# Patient Record
Sex: Female | Born: 2000 | Race: Black or African American | Marital: Single | Smoking: Never smoker
Health system: Southern US, Community
[De-identification: ages and names within clinical notes are randomized; demographics above are authoritative.]

## PROBLEM LIST (undated history)

## (undated) DIAGNOSIS — Z789 Other specified health status: Secondary | ICD-10-CM

---
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
## 2021-08-23 ENCOUNTER — Ambulatory Visit (INDEPENDENT_AMBULATORY_CARE_PROVIDER_SITE_OTHER): Payer: BC Managed Care – PPO | Admitting: Orthopedic Surgery

## 2021-08-23 DIAGNOSIS — S83511A Sprain of anterior cruciate ligament of right knee, initial encounter: Secondary | ICD-10-CM

## 2021-08-24 ENCOUNTER — Encounter: Payer: Self-pay | Admitting: Orthopedic Surgery

## 2021-08-24 ENCOUNTER — Other Ambulatory Visit: Payer: Self-pay

## 2021-08-24 DIAGNOSIS — M25561 Pain in right knee: Secondary | ICD-10-CM

## 2021-08-24 NOTE — Progress Notes (Signed)
   Post-Op Visit Note   Patient: Carolyn Cruz           Date of Birth: [birth year]           MRN: [id number] Visit Date: 08/23/2021 PCP: No primary care provider on file.   Assessment & Plan:  Chief Complaint: No chief complaint on file.  Visit Diagnoses:  1. New tear of anterior cruciate ligament, right, initial encounter     Plan: Patient presents for evaluation of right knee pain.  This was a witnessed injury during a [employer] basketball game.  Patient has had left knee ACL reconstruction done about 2 years ago.  She had several subsequent surgeries to that to regain her motion.  Felt a pop when she injured her knee.  Video is reviewed and it was a valgus collapse type injury.  On examination patient has palpable pedal pulses and the collateral ligaments are stable.  ACL laxity is present.  Exam is slightly difficult because of guarding.  No posterior lateral rotatory instability is noted.  Extensor mechanism is intact.  Fluoroscopic imaging demonstrates intact joint surfaces with no fracture.  Impression is right knee ACL laxity following witnessed valgus collapse injury.  Patient felt a pop in her Lachman and anterior drawer are positive.  Not too much tenderness around the MCL attachment on the femur or tibia.  Plan is MRI scan and rehabilitation to begin as soon as possible to prepare for intervention.  We will see what the scan shows but based on a guarded exam she does have laxity on that right-hand side.  Follow-Up Instructions: No follow-ups on file.   Orders:  No orders of the defined types were placed in this encounter.  No orders of the defined types were placed in this encounter.   Imaging: No results found.  PMFS History: There are no problems to display for this patient.  No past medical history on file.  No family history on file.  No past surgical history on file. Social History   Occupational History   Not on file  Tobacco Use   Smoking status: Not on file    Smokeless tobacco: Not on file  Substance and Sexual Activity   Alcohol use: Not on file   Drug use: Not on file   Sexual activity: Not on file

## 2021-08-26 ENCOUNTER — Ambulatory Visit
Admission: RE | Admit: 2021-08-26 | Discharge: 2021-08-26 | Disposition: A | Discharge status: Home / Self Care | Payer: BC Managed Care – PPO | Source: Ambulatory Visit | Attending: Orthopedic Surgery | Admitting: Orthopedic Surgery

## 2021-08-26 ENCOUNTER — Other Ambulatory Visit: Payer: Self-pay

## 2021-08-26 DIAGNOSIS — M25561 Pain in right knee: Secondary | ICD-10-CM

## 2021-09-13 ENCOUNTER — Other Ambulatory Visit: Payer: Self-pay

## 2021-09-13 ENCOUNTER — Encounter (HOSPITAL_COMMUNITY): Payer: Self-pay | Admitting: Orthopedic Surgery

## 2021-09-13 NOTE — Progress Notes (Signed)
PCP - Denies Cardiologist - Denies  PPM/ICD - Denies  Chest x-ray - Denies EKG - Denies  Stress Test - Denies ECHO - Denies Cardiac Cath - Denies  CPAP - Denies  ERAS Protcol - n/a  COVID TEST- n/a ambulatory sx  Anesthesia review: N  Patient verbally denies any shortness of breath, fever, cough and chest pain during phone call   -------------  SDW INSTRUCTIONS given:  Your procedure is scheduled on 09/18/21.  Report to [facility] Main Entrance "A" at 0530 A.M., and check in at the Admitting office.  Call this number if you have problems the morning of surgery:  [phone]   Remember:  Do not eat after midnight the night before your surgery    Take these medicines the morning of surgery with A SIP OF WATER Tylenol  As of today, STOP taking any Aspirin (unless otherwise instructed by your surgeon) Aleve, Naproxen, Ibuprofen, Motrin, Advil, Goody's, BC's, all herbal medications, fish oil, and all vitamins.                      Do not wear jewelry, make up, or nail polish            Do not wear lotions, powders, perfumes/colognes, or deodorant.            Do not shave 48 hours prior to surgery.  Men may shave face and neck.            Do not bring valuables to the hospital.            [facility] is not responsible for any belongings or valuables.  Do NOT Smoke (Tobacco/Vaping) or drink Alcohol 24 hours prior to your procedure If you use a CPAP at night, you may bring all equipment for your overnight stay.   Contacts, glasses, dentures or bridgework may not be worn into surgery.      For patients admitted to the hospital, discharge time will be determined by your treatment team.   Patients discharged the day of surgery will not be allowed to drive home, and someone needs to stay with them for 24 hours.    Special instructions:   [facility]- Preparing For Surgery  Before surgery, you can play an important role. Because skin is not sterile, your skin needs to be  as free of germs as possible. You can reduce the number of germs on your skin by washing with CHG (chlorahexidine gluconate) Soap before surgery.  CHG is an antiseptic cleaner which kills germs and bonds with the skin to continue killing germs even after washing.    Oral Hygiene is also important to reduce your risk of infection.  Remember - BRUSH YOUR TEETH THE MORNING OF SURGERY WITH YOUR REGULAR TOOTHPASTE  Please do not use if you have an allergy to CHG or antibacterial soaps. If your skin becomes reddened/irritated stop using the CHG.  Do not shave (including legs and underarms) for at least 48 hours prior to first CHG shower. It is OK to shave your face.  Please follow these instructions carefully.   Shower the NIGHT BEFORE SURGERY and the MORNING OF SURGERY with DIAL Soap.   Pat yourself dry with a CLEAN TOWEL.  Wear CLEAN PAJAMAS to bed the night before surgery  Place CLEAN SHEETS on your bed the night of your first shower and DO NOT SLEEP WITH PETS.   Day of Surgery: Please shower morning of surgery  Wear Clean/Comfortable clothing  the morning of surgery Do not apply any deodorants/lotions.   Remember to brush your teeth WITH YOUR REGULAR TOOTHPASTE.   Questions were answered. Patient verbalized understanding of instructions.

## 2021-09-17 NOTE — Anesthesia Preprocedure Evaluation (Addendum)
Anesthesia Evaluation  Patient identified by MRN, date of birth, ID band Patient awake    Reviewed: Allergy & Precautions, H&P , NPO status , Patient's Chart, lab work & pertinent test results  Airway Mallampati: I  TM Distance: >3 FB Neck ROM: Full    Dental no notable dental hx. (+) Teeth Intact, Dental Advisory Given   Pulmonary neg pulmonary ROS,    Pulmonary exam normal breath sounds clear to auscultation       Cardiovascular Exercise Tolerance: Good negative cardio ROS Normal cardiovascular exam Rhythm:Regular Rate:Normal     Neuro/Psych negative neurological ROS  negative psych ROS   GI/Hepatic negative GI ROS, Neg liver ROS,   Endo/Other  negative endocrine ROS  Renal/GU negative Renal ROS  negative genitourinary   Musculoskeletal negative musculoskeletal ROS (+)   Abdominal   Peds negative pediatric ROS (+)  Hematology negative hematology ROS (+)   Anesthesia Other Findings   Reproductive/Obstetrics negative OB ROS                            Anesthesia Physical Anesthesia Plan  ASA: 2  Anesthesia Plan: General and Regional   Post-op Pain Management: Ofirmev IV (intra-op), Ketamine IV and Dilaudid IV   Induction: Intravenous  PONV Risk Score and Plan: 3 and Ondansetron, Dexamethasone and Midazolam  Airway Management Planned: LMA and Oral ETT  Additional Equipment: None  Intra-op Plan:   Post-operative Plan: Extubation in OR  Informed Consent: I have reviewed the patients History and Physical, chart, labs and discussed the procedure including the risks, benefits and alternatives for the proposed anesthesia with the patient or authorized representative who has indicated his/her understanding and acceptance.     Dental advisory given  Plan Discussed with: CRNA, Surgeon and Anesthesiologist  Anesthesia Plan Comments: ( )        Anesthesia Quick  Evaluation

## 2021-09-18 ENCOUNTER — Encounter (HOSPITAL_COMMUNITY)
Admission: RE | Disposition: A | Discharge status: Home / Self Care | Payer: Self-pay | Source: Home / Self Care | Attending: Orthopedic Surgery

## 2021-09-18 ENCOUNTER — Ambulatory Visit (HOSPITAL_COMMUNITY)
Admission: RE | Admit: 2021-09-18 | Discharge: 2021-09-18 | Disposition: A | Discharge status: Home / Self Care | Payer: BC Managed Care – PPO | Attending: Orthopedic Surgery | Admitting: Orthopedic Surgery

## 2021-09-18 ENCOUNTER — Encounter: Payer: Self-pay | Admitting: Orthopedic Surgery

## 2021-09-18 ENCOUNTER — Other Ambulatory Visit: Payer: Self-pay | Admitting: Surgical

## 2021-09-18 ENCOUNTER — Ambulatory Visit (HOSPITAL_COMMUNITY): Payer: BC Managed Care – PPO

## 2021-09-18 ENCOUNTER — Ambulatory Visit (HOSPITAL_COMMUNITY): Payer: BC Managed Care – PPO | Admitting: Anesthesiology

## 2021-09-18 ENCOUNTER — Other Ambulatory Visit: Payer: Self-pay

## 2021-09-18 ENCOUNTER — Encounter (HOSPITAL_COMMUNITY): Payer: Self-pay | Admitting: Orthopedic Surgery

## 2021-09-18 DIAGNOSIS — S83281A Other tear of lateral meniscus, current injury, right knee, initial encounter: Secondary | ICD-10-CM | POA: Insufficient documentation

## 2021-09-18 DIAGNOSIS — Z01818 Encounter for other preprocedural examination: Secondary | ICD-10-CM

## 2021-09-18 DIAGNOSIS — S83511A Sprain of anterior cruciate ligament of right knee, initial encounter: Secondary | ICD-10-CM | POA: Diagnosis not present

## 2021-09-18 DIAGNOSIS — S83511D Sprain of anterior cruciate ligament of right knee, subsequent encounter: Secondary | ICD-10-CM | POA: Diagnosis not present

## 2021-09-18 DIAGNOSIS — Y9367 Activity, basketball: Secondary | ICD-10-CM | POA: Insufficient documentation

## 2021-09-18 DIAGNOSIS — S83271A Complex tear of lateral meniscus, current injury, right knee, initial encounter: Secondary | ICD-10-CM

## 2021-09-18 DIAGNOSIS — S83271D Complex tear of lateral meniscus, current injury, right knee, subsequent encounter: Secondary | ICD-10-CM

## 2021-09-18 HISTORY — PX: ANTERIOR CRUCIATE LIGAMENT REPAIR: SHX115

## 2021-09-18 HISTORY — DX: Other specified health status: Z78.9

## 2021-09-18 LAB — BASIC METABOLIC PANEL
Anion gap: 10 (ref 5–15)
BUN: 16 mg/dL (ref 6–20)
CO2: 24 mmol/L (ref 22–32)
Calcium: 9.1 mg/dL (ref 8.9–10.3)
Chloride: 103 mmol/L (ref 98–111)
Creatinine, Ser: 0.82 mg/dL (ref 0.44–1.00)
GFR, Estimated: >60 mL/min (ref >60)
Glucose, Bld: 98 mg/dL (ref 70–99)
Potassium: 3.8 mmol/L (ref 3.5–5.1)
Sodium: 137 mmol/L (ref 135–145)

## 2021-09-18 LAB — CBC
HCT: 43.2 % (ref 36.0–46.0)
Hemoglobin: 14.1 g/dL (ref 12.0–15.0)
MCH: 30.9 pg (ref 26.0–34.0)
MCHC: 32.6 g/dL (ref 30.0–36.0)
MCV: 94.7 fL (ref 80.0–100.0)
Platelets: 184 10*3/uL (ref 150–400)
RBC: 4.56 MIL/uL (ref 3.87–5.11)
RDW: 11.3 % — ABNORMAL LOW (ref 11.5–15.5)
WBC: 4.9 10*3/uL (ref 4.0–10.5)
nRBC: 0 % (ref 0.0–0.2)

## 2021-09-18 LAB — POCT PREGNANCY, URINE: Preg Test, Ur: NEGATIVE

## 2021-09-18 SURGERY — RECONSTRUCTION, KNEE, ACL
Anesthesia: Regional | Site: Knee | Laterality: Right

## 2021-09-18 MED ORDER — FENTANYL CITRATE (PF) 250 MCG/5ML IJ SOLN
INTRAMUSCULAR | Status: AC
  Filled 2021-09-18: qty 5

## 2021-09-18 MED ORDER — ACETAMINOPHEN 10 MG/ML IV SOLN
INTRAVENOUS | Status: DC | PRN
  Administered 2021-09-18: 1000 mg via INTRAVENOUS

## 2021-09-18 MED ORDER — BUPIVACAINE HCL (PF) 0.25 % IJ SOLN
INTRAMUSCULAR | Status: AC
  Filled 2021-09-18: qty 30

## 2021-09-18 MED ORDER — MORPHINE SULFATE (PF) 4 MG/ML IV SOLN
INTRAVENOUS | Status: AC
  Filled 2021-09-18: qty 2

## 2021-09-18 MED ORDER — OXYCODONE HCL 5 MG/5ML PO SOLN: 5.0000 mg | Freq: Once | ORAL | Status: AC | PRN

## 2021-09-18 MED ORDER — DEXAMETHASONE SODIUM PHOSPHATE 10 MG/ML IJ SOLN
INTRAMUSCULAR | Status: AC
  Filled 2021-09-18: qty 1

## 2021-09-18 MED ORDER — ROPIVACAINE HCL 7.5 MG/ML IJ SOLN
INTRAMUSCULAR | Status: DC | PRN
Start: 2021-09-18 — End: 2021-09-18
  Administered 2021-09-18: 30 mL via PERINEURAL

## 2021-09-18 MED ORDER — EPINEPHRINE PF 1 MG/ML IJ SOLN
INTRAMUSCULAR | Status: AC
  Filled 2021-09-18: qty 1

## 2021-09-18 MED ORDER — BUPIVACAINE-EPINEPHRINE (PF) 0.25% -1:200000 IJ SOLN
INTRAMUSCULAR | Status: AC
  Filled 2021-09-18: qty 30

## 2021-09-18 MED ORDER — EPINEPHRINE PF 1 MG/ML IJ SOLN
INTRAMUSCULAR | Status: AC
  Filled 2021-09-18: qty 3

## 2021-09-18 MED ORDER — CELECOXIB 200 MG PO CAPS: 200.0000 mg | ORAL_CAPSULE | Freq: Two times a day (BID) | ORAL | 0 refills | Status: AC

## 2021-09-18 MED ORDER — ONDANSETRON HCL 4 MG/2ML IJ SOLN
INTRAMUSCULAR | Status: AC
  Filled 2021-09-18: qty 4

## 2021-09-18 MED ORDER — LIDOCAINE 2% (20 MG/ML) 5 ML SYRINGE
INTRAMUSCULAR | Status: AC
  Filled 2021-09-18: qty 5

## 2021-09-18 MED ORDER — SODIUM CHLORIDE 0.9 % IR SOLN
Status: DC | PRN
  Administered 2021-09-18: 6000 mL
  Administered 2021-09-18: 12000 mL

## 2021-09-18 MED ORDER — ORAL CARE MOUTH RINSE: 15.0000 mL | Freq: Once | OROMUCOSAL | Status: AC

## 2021-09-18 MED ORDER — EPINEPHRINE PF 1 MG/ML IJ SOLN
INTRAMUSCULAR | Status: DC | PRN
  Administered 2021-09-18: 4 mg

## 2021-09-18 MED ORDER — ACETAMINOPHEN 10 MG/ML IV SOLN
INTRAVENOUS | Status: AC
  Filled 2021-09-18: qty 100

## 2021-09-18 MED ORDER — FENTANYL CITRATE (PF) 100 MCG/2ML IJ SOLN: 25.0000 ug | INTRAMUSCULAR | Status: DC | PRN

## 2021-09-18 MED ORDER — POVIDONE-IODINE 7.5 % EX SOLN: Freq: Once | CUTANEOUS | Status: DC

## 2021-09-18 MED ORDER — MIDAZOLAM HCL 2 MG/2ML IJ SOLN
INTRAMUSCULAR | Status: DC | PRN
Start: 2021-09-18 — End: 2021-09-18
  Administered 2021-09-18: 2 mg via INTRAVENOUS

## 2021-09-18 MED ORDER — ONDANSETRON HCL 4 MG/2ML IJ SOLN: 4.0000 mg | Freq: Once | INTRAMUSCULAR | Status: DC | PRN

## 2021-09-18 MED ORDER — MEPERIDINE HCL 25 MG/ML IJ SOLN: 6.2500 mg | INTRAMUSCULAR | Status: DC | PRN

## 2021-09-18 MED ORDER — PHENYLEPHRINE HCL-NACL 20-0.9 MG/250ML-% IV SOLN
INTRAVENOUS | Status: DC | PRN
  Administered 2021-09-18: 20 ug/min via INTRAVENOUS

## 2021-09-18 MED ORDER — ACETAMINOPHEN 160 MG/5ML PO SOLN: 325.0000 mg | ORAL | Status: DC | PRN

## 2021-09-18 MED ORDER — VANCOMYCIN HCL 1000 MG IV SOLR
INTRAVENOUS | Status: AC
  Filled 2021-09-18: qty 20

## 2021-09-18 MED ORDER — MORPHINE SULFATE (PF) 4 MG/ML IV SOLN
INTRAVENOUS | Status: DC | PRN
  Administered 2021-09-18: 8 mg via SUBCUTANEOUS

## 2021-09-18 MED ORDER — POVIDONE-IODINE 10 % EX SWAB
2.0000 "application " | Freq: Once | CUTANEOUS | Status: AC
  Administered 2021-09-18: 2 via TOPICAL

## 2021-09-18 MED ORDER — CLONIDINE HCL (ANALGESIA) 100 MCG/ML EP SOLN
EPIDURAL | Status: DC | PRN
  Administered 2021-09-18: 100 ug

## 2021-09-18 MED ORDER — ONDANSETRON HCL 4 MG/2ML IJ SOLN
INTRAMUSCULAR | Status: DC | PRN
Start: 2021-09-18 — End: 2021-09-18
  Administered 2021-09-18 (×2): 4 mg via INTRAVENOUS

## 2021-09-18 MED ORDER — KETAMINE HCL 10 MG/ML IJ SOLN
INTRAMUSCULAR | Status: DC | PRN
  Administered 2021-09-18: 30 mg via INTRAVENOUS

## 2021-09-18 MED ORDER — OXYCODONE HCL 5 MG PO TABS
ORAL_TABLET | ORAL | Status: AC
  Filled 2021-09-18: qty 1

## 2021-09-18 MED ORDER — CEFAZOLIN SODIUM-DEXTROSE 2-4 GM/100ML-% IV SOLN
2.0000 g | INTRAVENOUS | Status: AC
  Administered 2021-09-18: 2 g via INTRAVENOUS
  Filled 2021-09-18: qty 100

## 2021-09-18 MED ORDER — OXYCODONE-ACETAMINOPHEN 5-325 MG PO TABS: 1.0000 | ORAL_TABLET | ORAL | 0 refills | Status: AC | PRN

## 2021-09-18 MED ORDER — TRANEXAMIC ACID-NACL 1000-0.7 MG/100ML-% IV SOLN
INTRAVENOUS | Status: DC | PRN
  Administered 2021-09-18: 1000 mg via INTRAVENOUS

## 2021-09-18 MED ORDER — TRANEXAMIC ACID-NACL 1000-0.7 MG/100ML-% IV SOLN
INTRAVENOUS | Status: AC
  Filled 2021-09-18: qty 100

## 2021-09-18 MED ORDER — PROPOFOL 500 MG/50ML IV EMUL
INTRAVENOUS | Status: DC | PRN
  Administered 2021-09-18: 25 ug/kg/min via INTRAVENOUS

## 2021-09-18 MED ORDER — ACETAMINOPHEN 325 MG PO TABS: 325.0000 mg | ORAL_TABLET | ORAL | Status: DC | PRN

## 2021-09-18 MED ORDER — LIDOCAINE 2% (20 MG/ML) 5 ML SYRINGE
INTRAMUSCULAR | Status: DC | PRN
Start: 2021-09-18 — End: 2021-09-18
  Administered 2021-09-18: 80 mg via INTRAVENOUS

## 2021-09-18 MED ORDER — CLONIDINE HCL (ANALGESIA) 100 MCG/ML EP SOLN
EPIDURAL | Status: AC
  Filled 2021-09-18: qty 10

## 2021-09-18 MED ORDER — METHOCARBAMOL 500 MG PO TABS: 500.0000 mg | ORAL_TABLET | Freq: Three times a day (TID) | ORAL | 0 refills | Status: AC | PRN

## 2021-09-18 MED ORDER — FENTANYL CITRATE (PF) 250 MCG/5ML IJ SOLN
INTRAMUSCULAR | Status: DC | PRN
  Administered 2021-09-18: 150 ug via INTRAVENOUS
  Administered 2021-09-18 (×3): 50 ug via INTRAVENOUS

## 2021-09-18 MED ORDER — MIDAZOLAM HCL 2 MG/2ML IJ SOLN
INTRAMUSCULAR | Status: AC
  Filled 2021-09-18: qty 2

## 2021-09-18 MED ORDER — LACTATED RINGERS IV SOLN: INTRAVENOUS | Status: DC

## 2021-09-18 MED ORDER — 0.9 % SODIUM CHLORIDE (POUR BTL) OPTIME
TOPICAL | Status: DC | PRN
  Administered 2021-09-18: 1000 mL

## 2021-09-18 MED ORDER — ONDANSETRON HCL 4 MG/2ML IJ SOLN
INTRAMUSCULAR | Status: AC
  Filled 2021-09-18: qty 2

## 2021-09-18 MED ORDER — PROPOFOL 10 MG/ML IV BOLUS
INTRAVENOUS | Status: DC | PRN
  Administered 2021-09-18: 200 mg via INTRAVENOUS

## 2021-09-18 MED ORDER — OXYCODONE HCL 5 MG PO TABS
5.0000 mg | ORAL_TABLET | Freq: Once | ORAL | Status: AC | PRN
  Administered 2021-09-18: 5 mg via ORAL

## 2021-09-18 MED ORDER — ASPIRIN 81 MG PO CHEW: 81.0000 mg | CHEWABLE_TABLET | Freq: Every day | ORAL | 0 refills | Status: DC

## 2021-09-18 MED ORDER — BUPIVACAINE HCL (PF) 0.25 % IJ SOLN
INTRAMUSCULAR | Status: DC | PRN
  Administered 2021-09-18: 30 mL

## 2021-09-18 MED ORDER — POVIDONE-IODINE 10 % EX SWAB: 2.0000 "application " | Freq: Once | CUTANEOUS | Status: DC

## 2021-09-18 MED ORDER — VANCOMYCIN HCL 1 G IV SOLR
INTRAVENOUS | Status: DC | PRN
  Administered 2021-09-18: 1000 mg

## 2021-09-18 MED ORDER — KETAMINE HCL 50 MG/5ML IJ SOSY
PREFILLED_SYRINGE | INTRAMUSCULAR | Status: AC
  Filled 2021-09-18: qty 5

## 2021-09-18 MED ORDER — CHLORHEXIDINE GLUCONATE 0.12 % MT SOLN
15.0000 mL | Freq: Once | OROMUCOSAL | Status: AC
  Administered 2021-09-18: 15 mL via OROMUCOSAL
  Filled 2021-09-18: qty 15

## 2021-09-18 MED ORDER — BUPIVACAINE HCL (PF) 0.5 % IJ SOLN
INTRAMUSCULAR | Status: AC
  Filled 2021-09-18: qty 30

## 2021-09-18 MED ORDER — DEXAMETHASONE SODIUM PHOSPHATE 10 MG/ML IJ SOLN
INTRAMUSCULAR | Status: DC | PRN
Start: 2021-09-18 — End: 2021-09-18
  Administered 2021-09-18: 10 mg via INTRAVENOUS

## 2021-09-18 SURGICAL SUPPLY — 82 items
BAG COUNTER SPONGE SURGICOUNT (BAG) IMPLANT
BLADE AVERAGE 25X9 (BLADE) ×1 IMPLANT
BLADE EXCALIBUR 4.0X13 (MISCELLANEOUS) ×2 IMPLANT
BLADE SHAVER TORPEDO 4X13 (MISCELLANEOUS) ×1 IMPLANT
BLADE SURG 10 STRL SS (BLADE) ×2 IMPLANT
BLADE SURG 15 STRL LF DISP TIS (BLADE) ×2 IMPLANT
BLADE SURG 15 STRL SS (BLADE) ×2
BNDG ELASTIC 6X15 VLCR STRL LF (GAUZE/BANDAGES/DRESSINGS) ×1 IMPLANT
BURR OVAL 8 FLU 4.0X13 (MISCELLANEOUS) ×1 IMPLANT
COVER SURGICAL LIGHT HANDLE (MISCELLANEOUS) ×2 IMPLANT
CUFF TOURN SGL QUICK 34 (TOURNIQUET CUFF)
CUFF TOURN SGL QUICK 42 (TOURNIQUET CUFF) IMPLANT
CUFF TRNQT CYL 34X4.125X (TOURNIQUET CUFF) IMPLANT
CUTTER BONE 4.0MM X 13CM (MISCELLANEOUS) ×1 IMPLANT
DECANTER SPIKE VIAL GLASS SM (MISCELLANEOUS) ×2 IMPLANT
DRAPE ARTHROSCOPY W/POUCH 114 (DRAPES) ×2 IMPLANT
DRAPE INCISE IOBAN 66X45 STRL (DRAPES) ×2 IMPLANT
DRAPE OEC MINIVIEW 54X84 (DRAPES) IMPLANT
DRAPE ORTHO SPLIT 77X108 STRL (DRAPES) ×1
DRAPE SURG ORHT 6 SPLT 77X108 (DRAPES) ×1 IMPLANT
DRAPE U-SHAPE 47X51 STRL (DRAPES) ×2 IMPLANT
DRSG AQUACEL AG ADV 3.5X10 (GAUZE/BANDAGES/DRESSINGS) ×1 IMPLANT
DW OUTFLOW CASSETTE/TUBE SET (MISCELLANEOUS) ×2 IMPLANT
ELECT REM PT RETURN 9FT ADLT (ELECTROSURGICAL) ×2
ELECTRODE REM PT RTRN 9FT ADLT (ELECTROSURGICAL) ×1 IMPLANT
EXCALIBUR 3.8MM X 13CM (MISCELLANEOUS) ×1 IMPLANT
FIBER BONE ALLOSYNC EXPAND 10 (Bone Implant) ×1 IMPLANT
GLOVE SRG 8 PF TXTR STRL LF DI (GLOVE) ×1 IMPLANT
GLOVE SURG ENC MOIS LTX SZ7 (GLOVE) ×4 IMPLANT
GLOVE SURG LTX SZ8 (GLOVE) ×2 IMPLANT
GLOVE SURG UNDER LTX SZ7 (GLOVE) ×2 IMPLANT
GLOVE SURG UNDER POLY LF SZ8 (GLOVE) ×1
GOWN STRL REUS W/ TWL LRG LVL3 (GOWN DISPOSABLE) ×3 IMPLANT
GOWN STRL REUS W/ TWL XL LVL3 (GOWN DISPOSABLE) ×1 IMPLANT
GOWN STRL REUS W/TWL LRG LVL3 (GOWN DISPOSABLE) ×3
GOWN STRL REUS W/TWL XL LVL3 (GOWN DISPOSABLE) ×1
IMMOBILIZER KNEE 22 UNIV (SOFTGOODS) ×1 IMPLANT
IMP SYS 2ND FIX PEEK 4.75X19.1 (Miscellaneous) ×2 IMPLANT
IMPL SCREW BIO 8X30 (Screw) IMPLANT
IMPL SYS 2ND FX PEEK 4.75X19.1 (Miscellaneous) IMPLANT
IMPLANT SCREW BIO 8X30 (Screw) ×2 IMPLANT
KIT BASIN OR (CUSTOM PROCEDURE TRAY) ×2 IMPLANT
KIT BIOCARTILAGE LG JOINT MIX (KITS) ×2 IMPLANT
KIT TRANSTIBIAL (DISPOSABLE) ×1 IMPLANT
KIT TURNOVER KIT B (KITS) ×2 IMPLANT
MANIFOLD NEPTUNE II (INSTRUMENTS) ×2 IMPLANT
NDL 18GX1X1/2 (RX/OR ONLY) (NEEDLE) ×1 IMPLANT
NEEDLE 18GX1X1/2 (RX/OR ONLY) (NEEDLE) ×2 IMPLANT
NS IRRIG 1000ML POUR BTL (IV SOLUTION) ×2 IMPLANT
PACK ARTHROSCOPY DSU (CUSTOM PROCEDURE TRAY) ×2 IMPLANT
PAD ARMBOARD 7.5X6 YLW CONV (MISCELLANEOUS) ×4 IMPLANT
PAD CAST 4YDX4 CTTN HI CHSV (CAST SUPPLIES) ×1 IMPLANT
PAD COLD SHLDR WRAP-ON (PAD) ×2 IMPLANT
PADDING CAST COTTON 4X4 STRL (CAST SUPPLIES) ×1
PADDING CAST COTTON 6X4 STRL (CAST SUPPLIES) ×6 IMPLANT
PENCIL BUTTON HOLSTER BLD 10FT (ELECTRODE) IMPLANT
SCREW BIOCOMPOSITE 8X20 INTER (Screw) ×1 IMPLANT
SPONGE T-LAP 4X18 ~~LOC~~+RFID (SPONGE) ×4 IMPLANT
STRIP CLOSURE SKIN 1/2X4 (GAUZE/BANDAGES/DRESSINGS) ×1 IMPLANT
SUCTION FRAZIER HANDLE 10FR (MISCELLANEOUS) ×1
SUCTION TUBE FRAZIER 10FR DISP (MISCELLANEOUS) ×1 IMPLANT
SUT 2 FIBERLOOP 20 STRT BLUE (SUTURE)
SUT ETHILON 3 0 PS 1 (SUTURE) ×2 IMPLANT
SUT MNCRL AB 3-0 PS2 27 (SUTURE) ×1 IMPLANT
SUT MNCRL AB 4-0 PS2 18 (SUTURE) ×2 IMPLANT
SUT VIC AB 0 CT1 27 (SUTURE) ×2
SUT VIC AB 0 CT1 27XBRD ANBCTR (SUTURE) ×1 IMPLANT
SUT VIC AB 1 CT1 36 (SUTURE) ×3 IMPLANT
SUT VIC AB 2-0 CT1 27 (SUTURE) ×1
SUT VIC AB 2-0 CT1 TAPERPNT 27 (SUTURE) ×1 IMPLANT
SUT VICRYL 0 AB UR-6 (SUTURE) ×6 IMPLANT
SUTURE 2 FIBERLOOP 20 STRT BLU (SUTURE) IMPLANT
SUTURE TAPE 1.3 40 TPR END (SUTURE) IMPLANT
SUTURETAPE 1.3 40 TPR END (SUTURE) ×4
SYR 30ML LL (SYRINGE) ×2 IMPLANT
SYR BULB IRRIG 60ML STRL (SYRINGE) ×2 IMPLANT
SYR TB 1ML LUER SLIP (SYRINGE) ×2 IMPLANT
TOWEL GREEN STERILE (TOWEL DISPOSABLE) ×4 IMPLANT
TOWEL GREEN STERILE FF (TOWEL DISPOSABLE) ×2 IMPLANT
TUBING ARTHROSCOPY IRRIG 16FT (MISCELLANEOUS) ×2 IMPLANT
UNDERPAD 30X36 HEAVY ABSORB (UNDERPADS AND DIAPERS) ×2 IMPLANT
WATER STERILE IRR 1000ML POUR (IV SOLUTION) ×2 IMPLANT

## 2021-09-18 NOTE — Progress Notes (Signed)
Orthopedic Tech Progress Note Patient Details:  Carolyn Cruz July 05, 2001 [id number]  PACU RN called requesting a pair of CRUTCHES   Ortho Devices Type of Ortho Device: Crutches Ortho Device/Splint Interventions: Ordered, Application, Adjustment   Post Interventions Patient Tolerated: Ambulated well, Well Instructions Provided: Poper ambulation with device, Care of device  Donald Pore 09/18/2021, 12:21 PM

## 2021-09-18 NOTE — Op Note (Signed)
NAMEELMIRA, Carolyn Cruz MEDICAL RECORD NO: [id number] ACCOUNT NO: [id number] DATE OF BIRTH: [birth year] FACILITY: [facility] LOCATION: [facility] PHYSICIAN: Graylin Shiver. August Saucer, MD  Operative Report   DATE OF PROCEDURE: 09/18/2021  PREOPERATIVE DIAGNOSIS:  Right knee anterior cruciate ligament tear.  POSTOPERATIVE DIAGNOSIS:  Right knee anterior cruciate ligament tear and lateral meniscal tear.  PROCEDURE:  Right knee ACL reconstruction using bone patellar tendon bone autograft and partial lateral meniscectomy.  SURGEON:  Graylin Shiver. August Saucer, MD.  ASSISTANT:  Karenann Cai, PA.  INDICATIONS:  The patient is a 21 year old patient with right knee pain 4 weeks out from ACL tear playing basketball.  She presents now for operative management after explanation of risks and benefits.  DESCRIPTION OF PROCEDURE:  The patient was brought to the operating room where general anesthetic was induced.  Preoperative antibiotics administered.  Timeout was called.  Right leg was prescrubbed with alcohol, Betadine, and allowed to air dry.   Prepped with DuraPrep solution and draped in a sterile manner.  Preoperative examination under anesthesia demonstrated full extension, flexion to 130, stable collateral ligaments.  No posterolateral rotatory instability was noted.  The ACL was out with  positive Lachman, positive anterior drawer.  After sterile prepping and draping Ioban used to cover the operative field.  Leg was elevated and exsanguinated with the Esmarch wrap.  Tourniquet was inflated. After calling timeout anterior incision made  from the inferior pole of the patella down to the tibial tubercle was made.  Skin and subcutaneous tissue were sharply divided.  The paratenon was developed as a separate plane for later closure.  Using a 10 mm double wide harvesting knife, the bone  patellar tendon bone autograft was harvested.  Prepared on the back table by [facility].  Bone plug ends were approximately 18-20 mm and 10 mm in  diameter.  Thorough irrigation was performed.  The patellar tendon defect was closed using #1 Vicryl  suture.  Bone grafting was performed.  Autologous into the patellar defect and bone graft substitute from Arthrex into the tibial tubercle defect.  All in all both regions were securely bone grafted.  Peritenon was then closed over the defect.  Next,  anterior inferolateral and anterior inferomedial portals were established.  Diagnostic arthroscopy was performed.  The patient had a torn ACL with PCL was intact.  Had a little bit of chondromalacia on the medial femoral condyle.  There were small area.   Medial meniscus was intact.  Had some fraying near the meniscal root attachment, but no instability.  Lateral meniscus was inspected.  There was a tear in the white-white zone involving about 20% of the circumference over 1.5 cm area.  This tear was  debrided back to a stable rim using a combination of basket punch and shaver.  ACL stump debridement was performed and notchplasty performed.  The patellofemoral joint was intact.  Next, the tibial tunnel was drilled 10 mm acorn reamer followed by a 2 mm  over the top guide, placed in the 10:30 position on the lateral femoral condyle.  The guide pin was then advanced and reaming was performed.  The graft was passed and secured on the femoral side using an 8 x 20 mm interference screw with good fixation  achieved.  Next, the knee was taken through the range of motion, found to have no graft impingement.  Next, on the tibial side, an 8 x 30 mm screw was utilized and tensioned in full extension.  This gave very good stability.  Backup  fixation performed  with SwiveLock on the tibial side.  Tibial tunnel was also bone grafted.  Thorough irrigation was performed.  It should be noted that the tourniquet was let down after the graft harvest.  Thorough irrigation was performed.  Vancomycin powder sponge used  on the graft.  Fluid was then used to irrigate the incision.   Portals were closed using #1 Vicryl suture followed by interrupted inverted 0 Vicryl suture, 2-0 Vicryl suture, and 3-0 Monocryl.  Skin edges were anesthetized using Marcaine plain with  morphine and clonidine and then that was 20 mL, 10 mL injected into the joint for postop pain relief.  Suture ends cut on the femoral side.  Next, Steri-Strips and Aquacel dressing applied.  The patient had a nice extension.  Ace wrap, knee immobilizer  applied.  The patient tolerated the procedure well without immediate complications, transferred to the recovery room.  Luke's assistance was required for opening, closing, mobilization of tissue.  His assistance was a medical necessity.   PUS D: 09/18/2021 11:12:14 am T: 09/18/2021 11:37:00 am  JOB: [id number]/ [id number]

## 2021-09-18 NOTE — Anesthesia Procedure Notes (Signed)
Procedure Name: LMA Insertion Date/Time: 09/18/2021 7:17 AM Performed by: Darryl Nestle, CRNA Pre-anesthesia Checklist: Patient identified, Emergency Drugs available, Suction available and Patient being monitored Patient Re-evaluated:Patient Re-evaluated prior to induction Oxygen Delivery Method: Circle system utilized Preoxygenation: Pre-oxygenation with 100% oxygen Induction Type: IV induction LMA: LMA inserted LMA Size: 4.0 Tube type: Oral Number of attempts: 1 Placement Confirmation: positive ETCO2 and breath sounds checked- equal and bilateral Tube secured with: Tape Dental Injury: Teeth and Oropharynx as per pre-operative assessment

## 2021-09-18 NOTE — Brief Op Note (Signed)
° °  09/18/2021  11:04 AM  PATIENT:  Carolyn Cruz  21 y.o. female  PRE-OPERATIVE DIAGNOSIS:  Right Knee Anterior Cruciate Ligament Tear  POST-OPERATIVE DIAGNOSIS:  Right Knee Anterior Cruciate Ligament Tear,lateral meniscal tear  PROCEDURE:  Procedure(s): RIGHT KNEE ANTERIOR CRUCIATE LIGAMENT RECONSTRUCTION, BONE PATELLA TENDON BONE AUTOGRAFT, partial lateral menisectomy  SURGEON:  Surgeon(s): August Saucer, Corrie Mckusick, MD  ASSISTANT: magnant pa  ANESTHESIA:   general  EBL: 15 ml    Total I/O In: 1100 [I.V.:1100] Out: 115 [Urine:100; Blood:15]  BLOOD ADMINISTERED: none  DRAINS: none   LOCAL MEDICATIONS USED:  marcaine mso4 clonidine  SPECIMEN:  No Specimen  COUNTS:  YES  TOURNIQUET:   Total Tourniquet Time Documented: Thigh (Right) - 28 minutes Total: Thigh (Right) - 28 minutes   DICTATION: .Other Dictation: Dictation Number [id number]  PLAN OF CARE: Discharge to home after PACU  PATIENT DISPOSITION:  PACU - hemodynamically stable

## 2021-09-18 NOTE — Transfer of Care (Signed)
Immediate Anesthesia Transfer of Care Note  Patient: Carolyn Cruz  Procedure(s) Performed: RIGHT KNEE ANTERIOR CRUCIATE LIGAMENT RECONSTRUCTION, BONE PATELLA TENDON BONE AUTOGRAFT (Right: Knee)  Patient Location: PACU  Anesthesia Type:GA combined with regional for post-op pain  Level of Consciousness: awake  Airway & Oxygen Therapy: Patient connected to nasal cannula oxygen  Post-op Assessment: Report given to RN and Post -op Vital signs reviewed and stable  Post vital signs: Reviewed and stable  Last Vitals:  Vitals Value Taken Time  BP 114/72 09/18/21 1107  Temp 36.8 C 09/18/21 1107  Pulse 60 09/18/21 1113  Resp 12 09/18/21 1113  SpO2 100 % 09/18/21 1113  Vitals shown include unvalidated device data.  Last Pain:  Vitals:   09/18/21 1107  TempSrc:   PainSc: Asleep         Complications: No notable events documented.

## 2021-09-18 NOTE — Anesthesia Postprocedure Evaluation (Signed)
Anesthesia Post Note  Patient: Carolyn Cruz  Procedure(s) Performed: RIGHT KNEE ANTERIOR CRUCIATE LIGAMENT RECONSTRUCTION, BONE PATELLA TENDON BONE AUTOGRAFT (Right: Knee)     Patient location during evaluation: PACU Anesthesia Type: Regional and General Level of consciousness: awake and alert Pain management: pain level controlled Vital Signs Assessment: post-procedure vital signs reviewed and stable Respiratory status: spontaneous breathing, nonlabored ventilation, respiratory function stable and patient connected to nasal cannula oxygen Cardiovascular status: blood pressure returned to baseline and stable Postop Assessment: no apparent nausea or vomiting Anesthetic complications: no   No notable events documented.  Last Vitals:  Vitals:   09/18/21 1137 09/18/21 1152  BP: 114/78 116/60  Pulse: 70 63  Resp: 15 12  Temp:  36.9 C  SpO2: 100% 100%    Last Pain:  Vitals:   09/18/21 1130  TempSrc:   PainSc: 4                  Kenisha Lynds

## 2021-09-18 NOTE — Anesthesia Postprocedure Evaluation (Signed)
Anesthesia Post Note ° °Patient: Carolyn Cruz ° °Procedure(s) Performed: RIGHT KNEE ANTERIOR CRUCIATE LIGAMENT RECONSTRUCTION, BONE PATELLA TENDON BONE AUTOGRAFT (Right: Knee) ° °  ° °Patient location during evaluation: PACU °Anesthesia Type: Regional and General °Level of consciousness: awake and alert °Pain management: pain level controlled °Vital Signs Assessment: post-procedure vital signs reviewed and stable °Respiratory status: spontaneous breathing, nonlabored ventilation, respiratory function stable and patient connected to nasal cannula oxygen °Cardiovascular status: blood pressure returned to baseline and stable °Postop Assessment: no apparent nausea or vomiting °Anesthetic complications: no ° ° °No notable events documented. ° °Last Vitals:  °Vitals:  ° 09/18/21 1137 09/18/21 1152  °BP: 114/78 116/60  °Pulse: 70 63  °Resp: 15 12  °Temp:  36.9 °C  °SpO2: 100% 100%  °  °Last Pain:  °Vitals:  ° 09/18/21 1130  °TempSrc:   °PainSc: 4   ° ° °  °  °  °  °  °  ° °Maher Shon ° ° ° ° °

## 2021-09-18 NOTE — Anesthesia Procedure Notes (Signed)
Anesthesia Regional Block: Adductor canal block   Pre-Anesthetic Checklist: , timeout performed,  Correct Patient, Correct Site, Correct Laterality,  Correct Procedure, Correct Position, site marked,  Risks and benefits discussed,  Surgical consent,  Pre-op evaluation,  At surgeon's request and post-op pain management  Laterality: Right  Prep: chloraprep       Needles:  Injection technique: Single-shot  Needle Type: Echogenic Stimulator Needle     Needle Length: 5cm  Needle Gauge: 22     Additional Needles:   Procedures:, nerve stimulator,,, ultrasound used (permanent image in chart),,    Narrative:  Start time: 09/18/2021 7:10 AM End time: 09/18/2021 7:15 AM Injection made incrementally with aspirations every 5 mL.  Performed by: Personally  Anesthesiologist: Janeece Riggers, MD  Additional Notes: Functioning IV was confirmed and monitors were applied.  A 35mm 22ga Arrow echogenic stimulator needle was used. Sterile prep and drape,hand hygiene and sterile gloves were used. Ultrasound guidance: relevant anatomy identified, needle position confirmed, local anesthetic spread visualized around nerve(s)., vascular puncture avoided.  Image printed for medical record. Negative aspiration and negative test dose prior to incremental administration of local anesthetic. The patient tolerated the procedure well.

## 2021-09-18 NOTE — H&P (Signed)
Carolyn Cruz is an 21 y.o. female.   Chief Complaint: right knee pain HPI: Patient is a 21 year old female who injured her right knee during a basketball game approximately 4 weeks ago.  She sustained a valgus loading injury to her right knee.  Subsequent MRI scan demonstrates ACL tear with possible meniscal pathology.  She has done extensive prerehabilitation and has achieved good range of motion.  Notably she underwent left knee ACL reconstruction about 2 years ago.  This was done with bone patella tendon bone graft.  She did require 2 subsequent surgeries which was manipulation as well as cyclops lesion removal.  Otherwise she is done well from that surgery and return to full competitive level of play.  No personal or family history of DVT or pulmonary embolism.  Past Medical History:  Diagnosis Date   Medical history non-contributory     Past Surgical History:  Procedure Laterality Date   ACL and MCL reconstruction Left 05/26/2019    History reviewed. No pertinent family history. Social History:  reports that she has never smoked. She has never used smokeless tobacco. She reports that she does not drink alcohol and does not use drugs.  Allergies: No Known Allergies  Medications Prior to Admission  Medication Sig Dispense Refill   BIOTIN PO Take 1 tablet by mouth in the morning.      No results found for this or any previous visit (from the past 48 hour(s)). No results found.  Review of Systems  Musculoskeletal:  Positive for arthralgias.  All other systems reviewed and are negative.  Blood pressure 134/75, pulse 75, temperature 97.6 F (36.4 C), temperature source Oral, resp. rate 17, height 5\' 7"  (1.702 m), weight 72.6 kg, last menstrual period 09/06/2021, SpO2 100 %. Physical Exam Vitals reviewed.  HENT:     Head: Normocephalic.     Nose: Nose normal.     Mouth/Throat:     Mouth: Mucous membranes are moist.  Eyes:     Pupils: Pupils are equal, round, and reactive to  light.  Cardiovascular:     Rate and Rhythm: Normal rate.     Pulses: Normal pulses.  Pulmonary:     Effort: Pulmonary effort is normal.  Abdominal:     General: Abdomen is flat.  Musculoskeletal:     Cervical back: Normal range of motion.  Skin:    General: Skin is warm.     Capillary Refill: Capillary refill takes less than 2 seconds.  Neurological:     General: No focal deficit present.     Mental Status: She is alert.  Psychiatric:        Mood and Affect: Mood normal.  Right knee exam demonstrates trace effusion with almost full extension lacking only 2 to 3 degrees.  Flexion is about 130.  ACL laxity is present with positive Lachman positive anterior drawer.  No posterior lateral rotatory instability is noted.  No calf tenderness.  Pedal pulses intact.  Ankle dorsiflexion intact.  Assessment/Plan Impression is right knee ACL tear with menisci look pretty reasonable on MRI scanning.  Plan is right knee bone patellar tendon bone autograft ACL reconstruction.  10 mm graft was used on prior op notes tension at 20 degrees of flexion.  Based on her postoperative course I would likely tighten this graft closer to full extension.  Meniscal debridement and/or repair may be required but appears to be unlikely.  The risk and benefits are discussed including but not limited to infection nerve vessel damage knee  stiffness as well as potential for more surgery as well as potential for graft rerupture.  Patient understands the risk and benefits and wishes to proceed.  All questions answered. Burnard Bunting, MD 09/18/2021, 6:45 AM

## 2021-09-19 ENCOUNTER — Encounter (HOSPITAL_COMMUNITY): Payer: Self-pay | Admitting: Orthopedic Surgery

## 2021-09-26 ENCOUNTER — Ambulatory Visit (INDEPENDENT_AMBULATORY_CARE_PROVIDER_SITE_OTHER): Payer: BC Managed Care – PPO | Admitting: Orthopedic Surgery

## 2021-09-26 ENCOUNTER — Encounter: Payer: Self-pay | Admitting: Orthopedic Surgery

## 2021-09-26 ENCOUNTER — Other Ambulatory Visit: Payer: Self-pay

## 2021-09-26 DIAGNOSIS — Z9889 Other specified postprocedural states: Secondary | ICD-10-CM

## 2021-09-26 NOTE — Progress Notes (Signed)
° °  Post-Op Visit Note   Patient: Carolyn Cruz           Date of Birth: December 05, 2000           MRN: [id number] Visit Date: 09/26/2021 PCP: Pcp, No   Assessment & Plan:  Chief Complaint:  Chief Complaint  Patient presents with   Right Knee - Routine Post Op   Visit Diagnoses: No diagnosis found.  Plan: Patient is a 21 year old female who presents s/p right knee anterior cruciate ligament reconstruction with bone patella tendon bone autograft and lateral meniscectomy on 09/18/2021.  Patient is doing well.  Pain is overall controlled with the use of Celebrex.  She was taking aspirin for DVT prophylaxis but has stopped this.  She was instructed to continue aspirin until she has her typical mobility back.  Denies any chest pain, shortness of breath, calf pain, fevers, chills, night sweats.  She is using CPM machine and doing therapy exercises over the athletic trainer at the training room.  Measured her flexion yesterday at 71 degrees.  On exam today, she has incision that is healing well without any evidence of infection or dehiscence.  Mild effusion present.  Able to perform straight leg raise without extensor lag.  Extension to 0 degrees passively.  Flexion to about 70 degrees passively.  No calf tenderness.  Negative Homans' sign.  ACL graft is stable on Lachman exam without any hint of laxity at this point.  Plan is to continue with working on range of motion and quadricep strengthening exercises.  No open chain exercises, just stick with close chain exercises for now.  Follow-up in the training room.  Follow-Up Instructions: No follow-ups on file.   Orders:  No orders of the defined types were placed in this encounter.  No orders of the defined types were placed in this encounter.   Imaging: No results found.  PMFS History: There are no problems to display for this patient.  Past Medical History:  Diagnosis Date   Medical history non-contributory     No family history on file.   Past Surgical History:  Procedure Laterality Date   ACL and MCL reconstruction Left 05/26/2019   ANTERIOR CRUCIATE LIGAMENT REPAIR Right 09/18/2021   Procedure: RIGHT KNEE ANTERIOR CRUCIATE LIGAMENT RECONSTRUCTION, BONE PATELLA TENDON BONE AUTOGRAFT;  Surgeon: Cammy Copa, MD;  Location: [facility] OR;  Service: Orthopedics;  Laterality: Right;   Social History   Occupational History   Not on file  Tobacco Use   Smoking status: Never   Smokeless tobacco: Never  Vaping Use   Vaping Use: Never used  Substance and Sexual Activity   Alcohol use: Never   Drug use: Never   Sexual activity: Not on file

## 2021-10-01 ENCOUNTER — Encounter (HOSPITAL_COMMUNITY): Payer: Self-pay | Admitting: Orthopedic Surgery

## 2021-10-20 DIAGNOSIS — S83511A Sprain of anterior cruciate ligament of right knee, initial encounter: Secondary | ICD-10-CM

## 2021-10-20 DIAGNOSIS — S83271A Complex tear of lateral meniscus, current injury, right knee, initial encounter: Secondary | ICD-10-CM

## 2021-10-29 ENCOUNTER — Ambulatory Visit (INDEPENDENT_AMBULATORY_CARE_PROVIDER_SITE_OTHER): Payer: Self-pay | Admitting: Orthopedic Surgery

## 2021-10-29 DIAGNOSIS — Z9889 Other specified postprocedural states: Secondary | ICD-10-CM

## 2021-10-31 ENCOUNTER — Other Ambulatory Visit: Payer: Self-pay

## 2021-11-02 ENCOUNTER — Telehealth: Payer: Self-pay

## 2021-11-02 NOTE — Telephone Encounter (Signed)
Faxed OV and OP notes to [facility]

## 2021-11-03 ENCOUNTER — Encounter: Payer: Self-pay | Admitting: Orthopedic Surgery

## 2021-11-03 NOTE — Progress Notes (Signed)
° °  Post-Op Visit Note   Patient: Carolyn Cruz           Date of Birth: 02-03-2001           MRN: [id number] Visit Date: 10/29/2021 PCP: Pcp, No   Assessment & Plan:  Chief Complaint: No chief complaint on file.  Visit Diagnoses:  1. S/P ACL reconstruction     Plan: Patient presents for evaluation of right knee ACL reconstruction bone patella tendon bone performed 09/18/2021.  She is doing the bike and elliptical.  Range of motion is 0-1 16.  No effusion.  Graft is stable.  Not taking any medications except Omega and collagen support.  Quad strength is improving.  Plan at this time is to continue current rehab trajectory.  6-week return at which time we will likely add some agility training.  Follow-Up Instructions: No follow-ups on file.   Orders:  No orders of the defined types were placed in this encounter.  No orders of the defined types were placed in this encounter.   Imaging: No results found.  PMFS History: Patient Active Problem List   Diagnosis Date Noted   Rupture of anterior cruciate ligament of right knee    Complex tear of lateral meniscus of right knee as current injury    Past Medical History:  Diagnosis Date   Medical history non-contributory     No family history on file.  Past Surgical History:  Procedure Laterality Date   ACL and MCL reconstruction Left 05/26/2019   ANTERIOR CRUCIATE LIGAMENT REPAIR Right 09/18/2021   Procedure: RIGHT KNEE ANTERIOR CRUCIATE LIGAMENT RECONSTRUCTION, BONE PATELLA TENDON BONE AUTOGRAFT;  Surgeon: Cammy Copa, MD;  Location: [facility] OR;  Service: Orthopedics;  Laterality: Right;   Social History   Occupational History   Not on file  Tobacco Use   Smoking status: Never   Smokeless tobacco: Never  Vaping Use   Vaping Use: Never used  Substance and Sexual Activity   Alcohol use: Never   Drug use: Never   Sexual activity: Not on file

## 2022-02-05 ENCOUNTER — Ambulatory Visit (INDEPENDENT_AMBULATORY_CARE_PROVIDER_SITE_OTHER): Payer: Self-pay | Admitting: Orthopedic Surgery

## 2022-02-05 DIAGNOSIS — Z9889 Other specified postprocedural states: Secondary | ICD-10-CM

## 2022-02-18 ENCOUNTER — Encounter: Payer: Self-pay | Admitting: Orthopedic Surgery

## 2022-02-18 NOTE — Progress Notes (Signed)
   Post-Op Visit Note   Patient: Carolyn Cruz           Date of Birth: 2000/09/17           MRN: [id number] Visit Date: 02/05/2022 PCP: Pcp, No   Assessment & Plan:  Chief Complaint: No chief complaint on file.  Visit Diagnoses:  1. S/P ACL reconstruction     Plan: Patient presents for evaluation 4 months out right knee ACL reconstruction bone patella tendon bone.  She is doing rehab and weights.  Doing squatting with the bar.  On examination she has very good range of motion with good graft stability and no effusion.  Like for her to start some basketball specific change of direction drills along with the urate running.  Squatting is okay.  57-month return for progression.  Strength looks excellent in the quad and hamstring regions.  Follow-Up Instructions: No follow-ups on file.   Orders:  No orders of the defined types were placed in this encounter.  No orders of the defined types were placed in this encounter.   Imaging: No results found.  PMFS History: Patient Active Problem List   Diagnosis Date Noted   Rupture of anterior cruciate ligament of right knee    Complex tear of lateral meniscus of right knee as current injury    Past Medical History:  Diagnosis Date   Medical history non-contributory     No family history on file.  Past Surgical History:  Procedure Laterality Date   ACL and MCL reconstruction Left 05/26/2019   ANTERIOR CRUCIATE LIGAMENT REPAIR Right 09/18/2021   Procedure: RIGHT KNEE ANTERIOR CRUCIATE LIGAMENT RECONSTRUCTION, BONE PATELLA TENDON BONE AUTOGRAFT;  Surgeon: Meredith Pel, MD;  Location: [place];  Service: Orthopedics;  Laterality: Right;   Social History   Occupational History   Not on file  Tobacco Use   Smoking status: Never   Smokeless tobacco: Never  Vaping Use   Vaping Use: Never used  Substance and Sexual Activity   Alcohol use: Never   Drug use: Never   Sexual activity: Not on file

## 2022-04-01 ENCOUNTER — Ambulatory Visit (INDEPENDENT_AMBULATORY_CARE_PROVIDER_SITE_OTHER): Payer: Self-pay | Admitting: Orthopedic Surgery

## 2022-04-01 DIAGNOSIS — Z9889 Other specified postprocedural states: Secondary | ICD-10-CM

## 2022-04-13 IMAGING — MR MR KNEE*R* W/O CM
4 of 7 series · 19 of 40 positions shown · non-contrast
Comparison: None.

CLINICAL DATA: Baseball injury 3 days ago. Diffuse right knee pain
with swelling. MR evaluation for ACL tear.

EXAM:
MRI OF THE RIGHT KNEE WITHOUT CONTRAST
TECHNIQUE: Multiplanar, multisequence MR imaging of the knee was performed. No
intravenous contrast was administered.

[Series 2: T2 fat-sat · axial · 4.0mm · 0.29mm/px · z∈[-55,+51]mm · 3 of 25 slices shown]
[im 1/25]
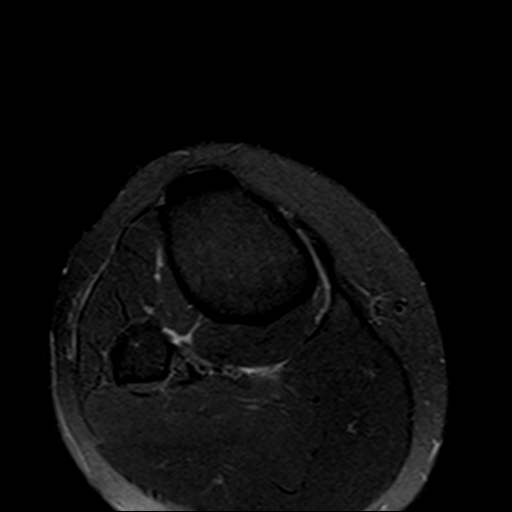
[im 13/25]
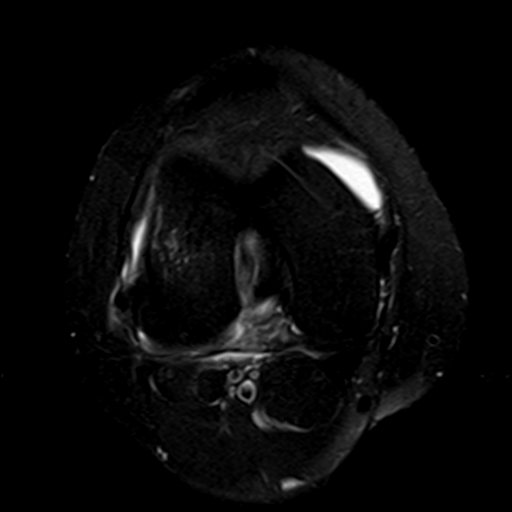
[im 25/25]
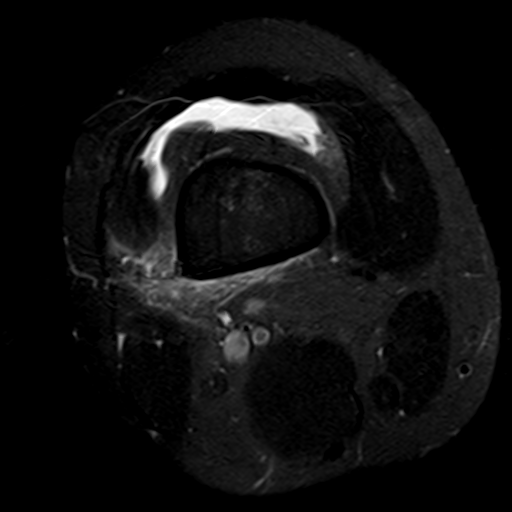

[Series 5: PD fat-sat · coronal · 3.0mm · 0.29mm/px · 7 of 28 slices shown (1 of 3)]
[im 1/28]
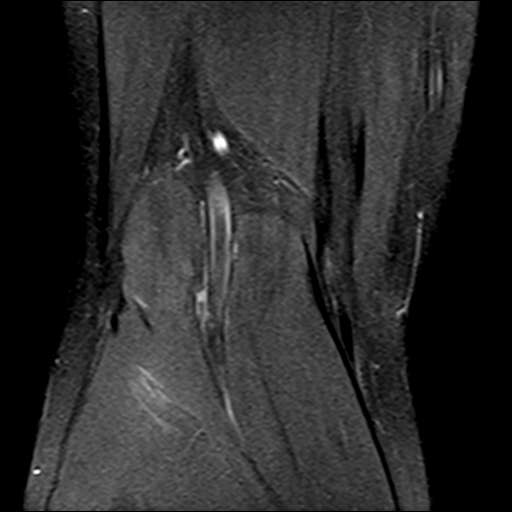
[im 5/28]
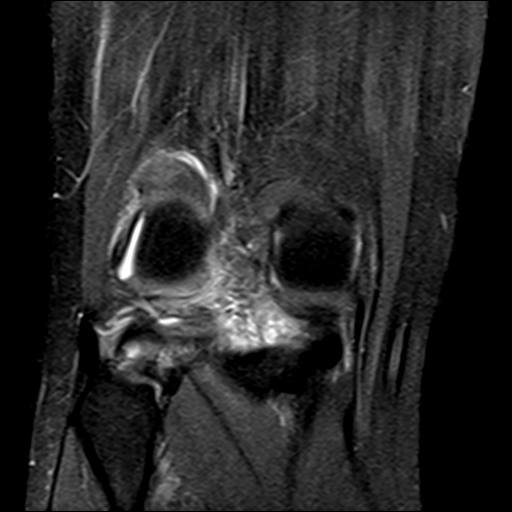
[im 10/28]
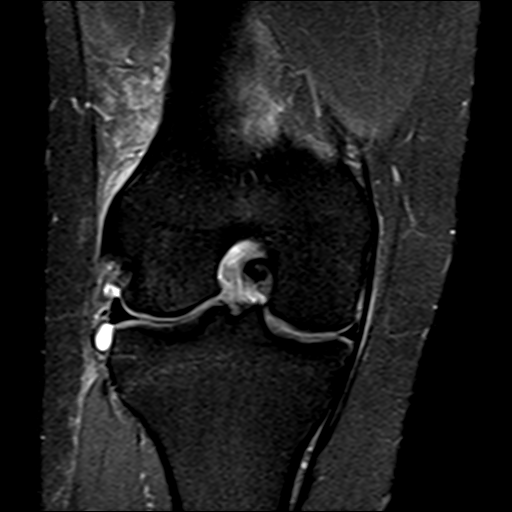
[im 14/28]
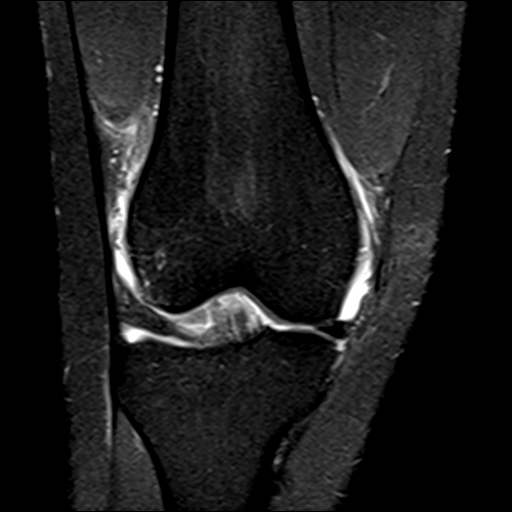
[im 19/28]
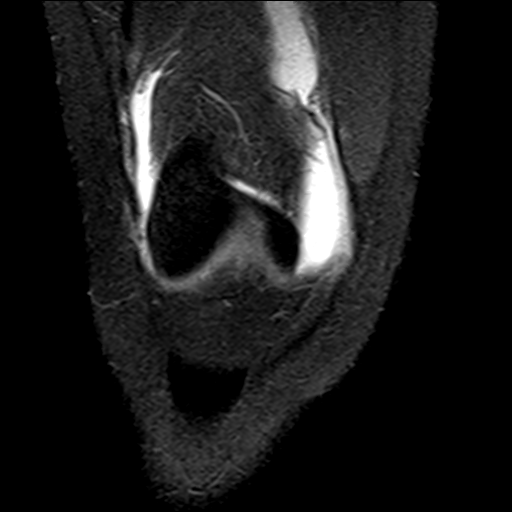
[im 23/28]
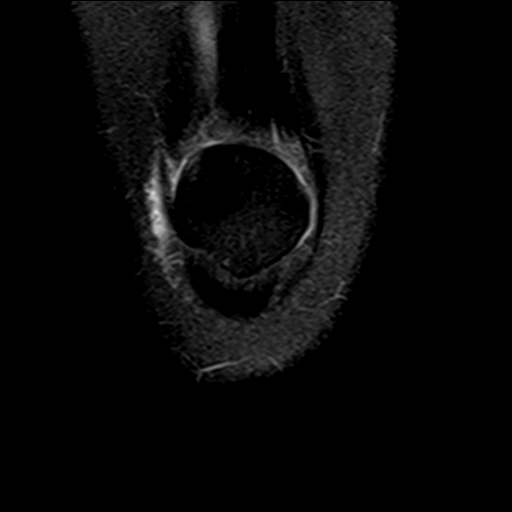
[im 28/28]
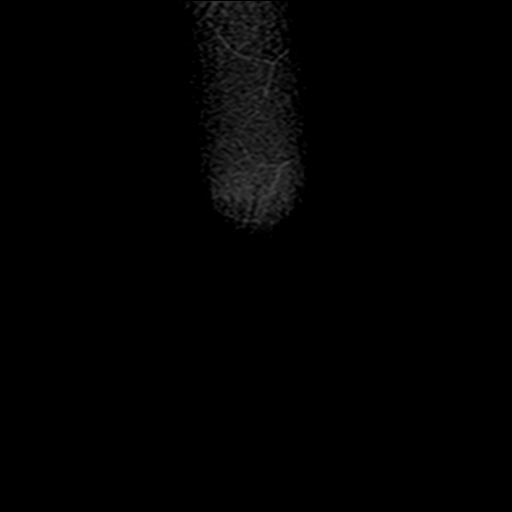

[Series 6: PD fat-sat · sagittal · 3.0mm · 0.29mm/px · 6 of 30 slices shown (2 of 3)]
[im 1/30]
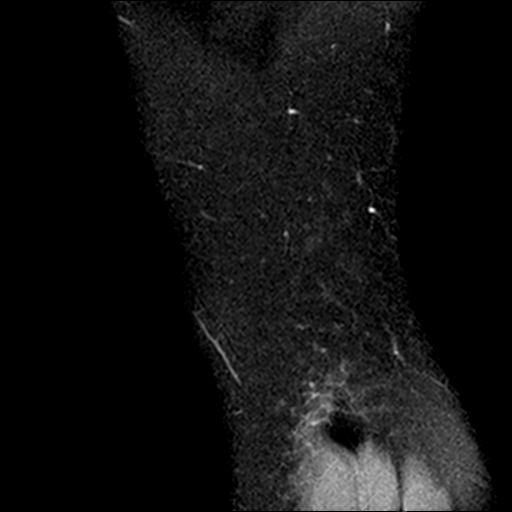
[im 5/30]
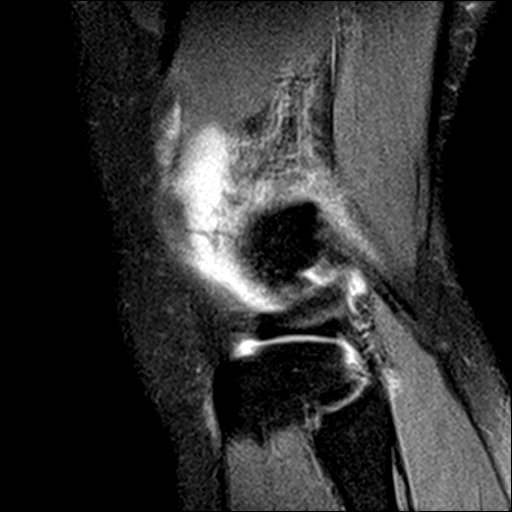
[im 10/30]
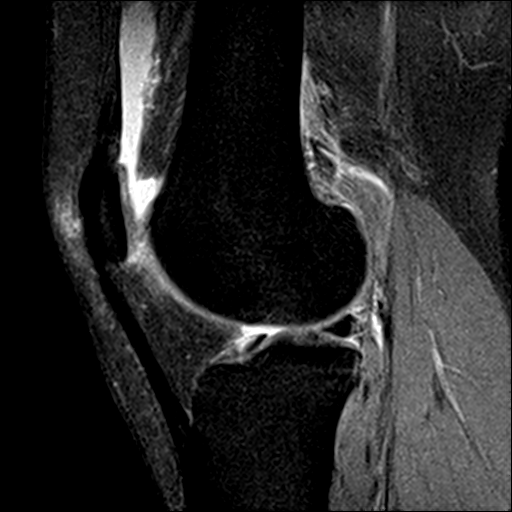
[im 15/30]
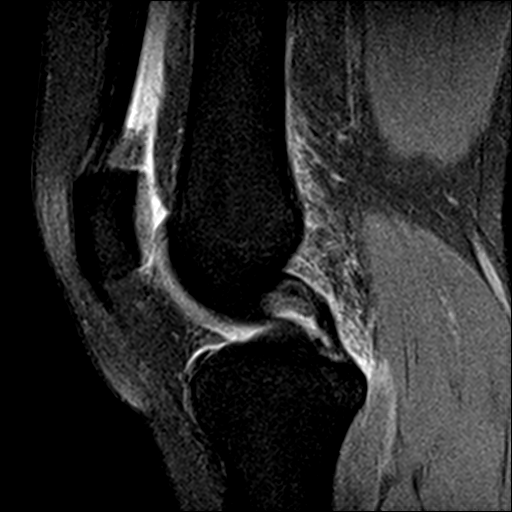
[im 20/30]
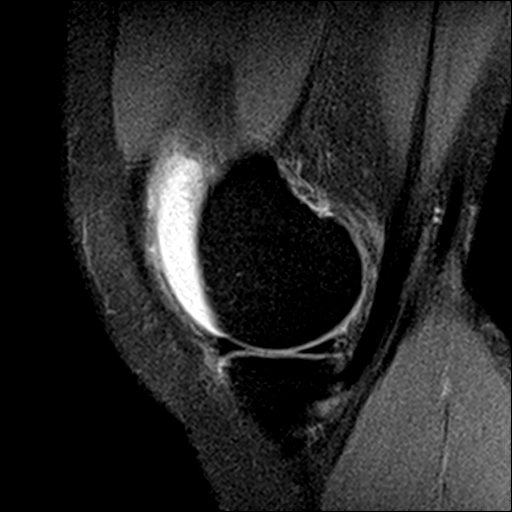
[im 25/30]
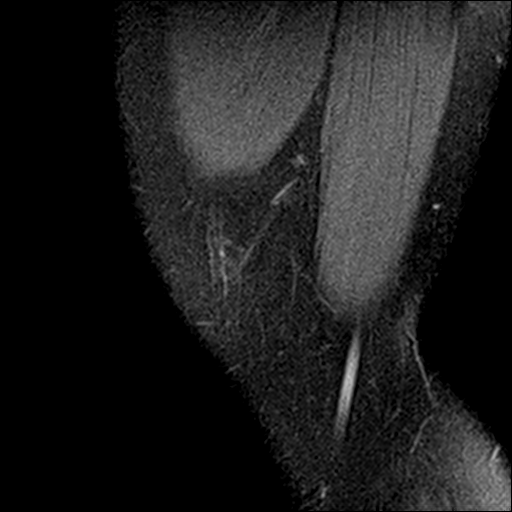

[Series 8: PD fat-sat · oblique · 2.0mm · 0.29mm/px · 3 of 11 slices shown (3 of 3)]
[im 1/11]
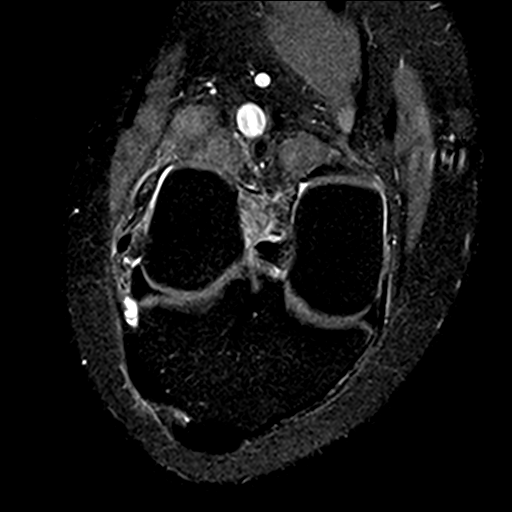
[im 6/11]
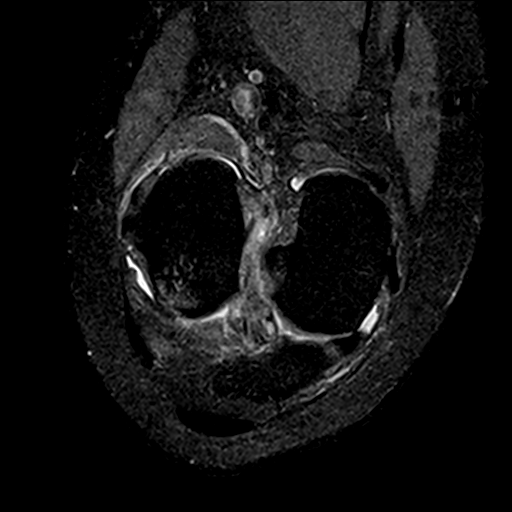
[im 11/11]
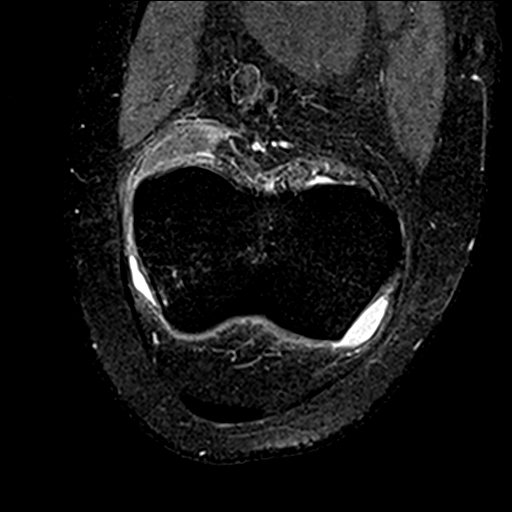

[19 of 40 positions shown; findings below may reference images not displayed]

FINDINGS: MENISCI

Medial meniscus:  Intact.

Lateral meniscus:  Intact.

LIGAMENTS

Cruciates: Full-thickness tear of the anterior cruciate ligament
with marked surrounding edema. Posterior cruciate ligament is
intact.

Collaterals: Medial collateral ligament is intact. Mild edema of the
lateral collateral ligament concerning for grade 1 sprain.

CARTILAGE

Patellofemoral:  No chondral defect.

Medial: Mild partial-thickness cartilage loss of the medial
femorotibial compartment.

Lateral:  No chondral defect.

Joint:  Moderate suprapatellar joint effusion.

Popliteal Fossa:  No Baker cyst. Intact popliteus tendon.

Extensor Mechanism:

Bones: Bone contusions on the lateral femoral condyle as well as
posterior tibia concerning for kissing contusions.

Other: None.
IMPRESSION: 1.  Full-thickness tear of the anterior cruciate ligament.

2.  Moderate joint effusion, likely secondary to ACL injury.

3. Focal bone contusions of the lateral femoral condyle and
posterior tibia, likely representing pivot-shift injury.

4.  Grade 1 sprain of the lateral collateral ligament.

## 2022-04-14 ENCOUNTER — Encounter: Payer: Self-pay | Admitting: Orthopedic Surgery

## 2022-04-14 NOTE — Progress Notes (Signed)
   Post-Op Visit Note   Patient: Carolyn Cruz           Date of Birth: 2001-05-23           MRN: [id number] Visit Date: 04/01/2022 PCP: Pcp, No   Assessment & Plan:  Chief Complaint: No chief complaint on file.  Visit Diagnoses: No diagnosis found.  Plan: Patient presents for evaluation of her right knee following ACL reconstruction.  She has been doing some practice as well as one-on-one drills.  Using a compression sleeve.  She is doing shooting drills as well as full-court running.  On examination she has about a 3 to 4 degree flexion contracture bilaterally.  Graft is stable with no effusion.  No medial or lateral joint line tenderness.  Plan at this time is to continue progressing with rehabilitation.  That includes integration into practice as well as some planting and cutting.  Overall Trinna Post has her on a good trajectory for return to play for the winter season.  Follow-Up Instructions: Return in about 8 weeks (around 05/27/2022).   Orders:  No orders of the defined types were placed in this encounter.  No orders of the defined types were placed in this encounter.   Imaging: No results found.  PMFS History: Patient Active Problem List   Diagnosis Date Noted   Rupture of anterior cruciate ligament of right knee    Complex tear of lateral meniscus of right knee as current injury    Past Medical History:  Diagnosis Date   Medical history non-contributory     History reviewed. No pertinent family history.  Past Surgical History:  Procedure Laterality Date   ACL and MCL reconstruction Left 05/26/2019   ANTERIOR CRUCIATE LIGAMENT REPAIR Right 09/18/2021   Procedure: RIGHT KNEE ANTERIOR CRUCIATE LIGAMENT RECONSTRUCTION, BONE PATELLA TENDON BONE AUTOGRAFT;  Surgeon: Cammy Copa, MD;  Location: [facility];  Service: Orthopedics;  Laterality: Right;   Social History   Occupational History   Not on file  Tobacco Use   Smoking status: Never   Smokeless tobacco: Never   Vaping Use   Vaping Use: Never used  Substance and Sexual Activity   Alcohol use: Never   Drug use: Never   Sexual activity: Not on file

## 2022-05-27 ENCOUNTER — Ambulatory Visit (INDEPENDENT_AMBULATORY_CARE_PROVIDER_SITE_OTHER): Payer: Self-pay | Admitting: Orthopedic Surgery

## 2022-05-27 DIAGNOSIS — Z9889 Other specified postprocedural states: Secondary | ICD-10-CM

## 2022-06-04 ENCOUNTER — Encounter: Payer: Self-pay | Admitting: Orthopedic Surgery

## 2022-06-04 NOTE — Progress Notes (Signed)
   Post-Op Visit Note   Patient: Carolyn Cruz           Date of Birth: [birth year]           MRN: [id number] Visit Date: 05/27/2022 PCP: Pcp, No   Assessment & Plan:  Chief Complaint: No chief complaint on file.  Visit Diagnoses: No diagnosis found.  Plan: Patient presents now 8 months out ACL reconstruction on the right knee.  Has not done any life play yet but has been doing a lot of drilling.  Defensive drills and [place] drills.  Some 3 on 2 and some 5 on 5.  She has some muscle soreness by Thursday or Friday of the workout week.  Denies any swelling in the knee.  On examination she has symmetric range of motion with the left knee.  No effusion with very good graft stability and no joint line tenderness.  At this time she has made excellent progress with no muscle atrophy and the symmetric strength in both legs.  Okay for her to return to play 5 on 5 with gradual acclamation into live ball.  Follow-up as needed.  Follow-Up Instructions: No follow-ups on file.   Orders:  No orders of the defined types were placed in this encounter.  No orders of the defined types were placed in this encounter.   Imaging: No results found.  PMFS History: Patient Active Problem List   Diagnosis Date Noted   Rupture of anterior cruciate ligament of right knee    Complex tear of lateral meniscus of right knee as current injury    Past Medical History:  Diagnosis Date   Medical history non-contributory     No family history on file.  Past Surgical History:  Procedure Laterality Date   ACL and MCL reconstruction Left 05/26/2019   ANTERIOR CRUCIATE LIGAMENT REPAIR Right 09/18/2021   Procedure: RIGHT KNEE ANTERIOR CRUCIATE LIGAMENT RECONSTRUCTION, BONE PATELLA TENDON BONE AUTOGRAFT;  Surgeon: Meredith Pel, MD;  Location: [facility];  Service: Orthopedics;  Laterality: Right;   Social History   Occupational History   Not on file  Tobacco Use   Smoking status: Never   Smokeless tobacco: Never   Vaping Use   Vaping Use: Never used  Substance and Sexual Activity   Alcohol use: Never   Drug use: Never   Sexual activity: Not on file
